# Patient Record
Sex: Female | Born: 1965 | Race: Black or African American | Hispanic: No | Marital: Single
Health system: Southern US, Community
[De-identification: ages and names within clinical notes are randomized; demographics above are authoritative.]

---

## 2001-03-13 ENCOUNTER — Encounter: Payer: Self-pay | Admitting: Family Medicine

## 2001-03-13 ENCOUNTER — Ambulatory Visit (HOSPITAL_COMMUNITY): Admission: RE | Admit: 2001-03-13 | Discharge: 2001-03-13 | Payer: Self-pay | Admitting: Family Medicine

## 2001-09-26 ENCOUNTER — Other Ambulatory Visit: Admission: RE | Admit: 2001-09-26 | Discharge: 2001-09-26 | Payer: Self-pay | Admitting: Family Medicine

## 2002-04-20 ENCOUNTER — Ambulatory Visit (HOSPITAL_COMMUNITY): Admission: RE | Admit: 2002-04-20 | Discharge: 2002-04-20 | Payer: Self-pay | Admitting: Family Medicine

## 2002-04-20 ENCOUNTER — Encounter: Payer: Self-pay | Admitting: Family Medicine

## 2002-11-25 ENCOUNTER — Other Ambulatory Visit: Admission: RE | Admit: 2002-11-25 | Discharge: 2002-11-25 | Payer: Self-pay | Admitting: Ophthalmology

## 2003-07-27 ENCOUNTER — Ambulatory Visit (HOSPITAL_COMMUNITY): Admission: RE | Admit: 2003-07-27 | Discharge: 2003-07-27 | Payer: Self-pay | Admitting: Endocrinology

## 2004-05-18 ENCOUNTER — Other Ambulatory Visit: Admission: RE | Admit: 2004-05-18 | Discharge: 2004-05-18 | Payer: Self-pay | Admitting: Family Medicine

## 2005-06-26 ENCOUNTER — Other Ambulatory Visit: Admission: RE | Admit: 2005-06-26 | Discharge: 2005-06-26 | Payer: Self-pay | Admitting: Family Medicine

## 2006-09-18 ENCOUNTER — Other Ambulatory Visit: Admission: RE | Admit: 2006-09-18 | Discharge: 2006-09-18 | Payer: Self-pay | Admitting: Family Medicine

## 2010-04-18 ENCOUNTER — Encounter (INDEPENDENT_AMBULATORY_CARE_PROVIDER_SITE_OTHER): Payer: Self-pay | Admitting: *Deleted

## 2010-04-18 ENCOUNTER — Ambulatory Visit: Payer: Self-pay | Admitting: Internal Medicine

## 2010-04-18 LAB — CONVERTED CEMR LAB
ALT: 30 units/L (ref 0–35)
AST: 27 units/L (ref 0–37)
Albumin: 4.4 g/dL (ref 3.5–5.2)
Alkaline Phosphatase: 82 units/L (ref 39–117)
BUN: 8 mg/dL (ref 6–23)
CO2: 24 meq/L (ref 19–32)
Calcium: 8.9 mg/dL (ref 8.4–10.5)
Chloride: 108 meq/L (ref 96–112)
Creatinine, Ser: 0.73 mg/dL (ref 0.40–1.20)
Free T4: 0.79 ng/dL — ABNORMAL LOW (ref 0.80–1.80)
Glucose, Bld: 93 mg/dL (ref 70–99)
Potassium: 4.9 meq/L (ref 3.5–5.3)
Sodium: 141 meq/L (ref 135–145)
T3, Total: 72.1 ng/dL — ABNORMAL LOW (ref 80.0–204.0)
TSH: 4.965 microintl units/mL — ABNORMAL HIGH (ref 0.350–4.500)
Total Bilirubin: 0.3 mg/dL (ref 0.3–1.2)
Total Protein: 7.3 g/dL (ref 6.0–8.3)

## 2010-04-28 ENCOUNTER — Emergency Department (HOSPITAL_COMMUNITY): Admission: EM | Admit: 2010-04-28 | Discharge: 2010-04-28 | Payer: Self-pay | Admitting: Family Medicine

## 2011-10-29 ENCOUNTER — Other Ambulatory Visit: Payer: Self-pay | Admitting: Family Medicine

## 2013-06-11 ENCOUNTER — Other Ambulatory Visit (HOSPITAL_COMMUNITY): Payer: Self-pay | Admitting: Nurse Practitioner

## 2013-06-11 DIAGNOSIS — Z1231 Encounter for screening mammogram for malignant neoplasm of breast: Secondary | ICD-10-CM

## 2013-06-25 ENCOUNTER — Ambulatory Visit (HOSPITAL_COMMUNITY)
Admission: RE | Admit: 2013-06-25 | Discharge: 2013-06-25 | Disposition: A | Discharge status: Home / Self Care | Source: Ambulatory Visit | Attending: Nurse Practitioner | Admitting: Nurse Practitioner

## 2013-06-25 DIAGNOSIS — Z1231 Encounter for screening mammogram for malignant neoplasm of breast: Secondary | ICD-10-CM | POA: Insufficient documentation

## 2014-12-22 ENCOUNTER — Other Ambulatory Visit: Payer: Self-pay | Admitting: Physician Assistant

## 2014-12-22 ENCOUNTER — Other Ambulatory Visit (HOSPITAL_COMMUNITY)
Admission: RE | Admit: 2014-12-22 | Discharge: 2014-12-22 | Disposition: A | Discharge status: Home / Self Care | Payer: 59 | Source: Ambulatory Visit | Attending: Family Medicine | Admitting: Family Medicine

## 2014-12-22 DIAGNOSIS — Z124 Encounter for screening for malignant neoplasm of cervix: Secondary | ICD-10-CM | POA: Diagnosis not present

## 2014-12-27 ENCOUNTER — Other Ambulatory Visit (HOSPITAL_COMMUNITY): Payer: Self-pay | Admitting: Physician Assistant

## 2014-12-27 DIAGNOSIS — Z1231 Encounter for screening mammogram for malignant neoplasm of breast: Secondary | ICD-10-CM

## 2014-12-27 LAB — CYTOLOGY - PAP

## 2015-01-03 ENCOUNTER — Ambulatory Visit (HOSPITAL_COMMUNITY)
Admission: RE | Admit: 2015-01-03 | Discharge: 2015-01-03 | Disposition: A | Discharge status: Home / Self Care | Payer: 59 | Source: Ambulatory Visit | Attending: Physician Assistant | Admitting: Physician Assistant

## 2015-01-03 DIAGNOSIS — Z1231 Encounter for screening mammogram for malignant neoplasm of breast: Secondary | ICD-10-CM

## 2016-02-27 ENCOUNTER — Other Ambulatory Visit: Payer: Self-pay | Admitting: Gastroenterology

## 2016-03-23 ENCOUNTER — Other Ambulatory Visit: Payer: Self-pay | Admitting: Physician Assistant

## 2016-03-23 DIAGNOSIS — Z1231 Encounter for screening mammogram for malignant neoplasm of breast: Secondary | ICD-10-CM

## 2017-10-28 ENCOUNTER — Other Ambulatory Visit: Payer: Self-pay | Admitting: Physician Assistant

## 2017-10-28 DIAGNOSIS — R748 Abnormal levels of other serum enzymes: Secondary | ICD-10-CM

## 2017-10-31 ENCOUNTER — Ambulatory Visit
Admission: RE | Admit: 2017-10-31 | Discharge: 2017-10-31 | Disposition: A | Discharge status: Home / Self Care | Payer: BLUE CROSS/BLUE SHIELD | Source: Ambulatory Visit | Attending: Physician Assistant | Admitting: Physician Assistant

## 2017-10-31 DIAGNOSIS — R748 Abnormal levels of other serum enzymes: Secondary | ICD-10-CM

## 2018-01-22 ENCOUNTER — Other Ambulatory Visit: Payer: Self-pay | Admitting: Physician Assistant

## 2018-01-22 DIAGNOSIS — Z1231 Encounter for screening mammogram for malignant neoplasm of breast: Secondary | ICD-10-CM

## 2018-02-17 ENCOUNTER — Ambulatory Visit
Admission: RE | Admit: 2018-02-17 | Discharge: 2018-02-17 | Disposition: A | Discharge status: Home / Self Care | Payer: BLUE CROSS/BLUE SHIELD | Source: Ambulatory Visit | Attending: Physician Assistant | Admitting: Physician Assistant

## 2018-02-17 DIAGNOSIS — Z1231 Encounter for screening mammogram for malignant neoplasm of breast: Secondary | ICD-10-CM

## 2018-02-20 ENCOUNTER — Other Ambulatory Visit: Payer: Self-pay | Admitting: Physician Assistant

## 2018-02-20 ENCOUNTER — Other Ambulatory Visit (HOSPITAL_COMMUNITY)
Admission: RE | Admit: 2018-02-20 | Discharge: 2018-02-20 | Disposition: A | Discharge status: Home / Self Care | Payer: BLUE CROSS/BLUE SHIELD | Source: Ambulatory Visit | Attending: Family Medicine | Admitting: Family Medicine

## 2018-02-20 DIAGNOSIS — Z124 Encounter for screening for malignant neoplasm of cervix: Secondary | ICD-10-CM | POA: Diagnosis not present

## 2018-02-24 LAB — CYTOLOGY - PAP: Diagnosis: NEGATIVE

## 2020-04-22 ENCOUNTER — Other Ambulatory Visit: Payer: Self-pay | Admitting: Family Medicine

## 2020-04-22 DIAGNOSIS — Z1231 Encounter for screening mammogram for malignant neoplasm of breast: Secondary | ICD-10-CM

## 2020-06-14 ENCOUNTER — Ambulatory Visit: Payer: BLUE CROSS/BLUE SHIELD

## 2020-06-24 ENCOUNTER — Other Ambulatory Visit: Payer: Self-pay

## 2020-06-24 ENCOUNTER — Ambulatory Visit
Admission: RE | Admit: 2020-06-24 | Discharge: 2020-06-24 | Disposition: A | Discharge status: Home / Self Care | Payer: Managed Care, Other (non HMO) | Source: Ambulatory Visit | Attending: Family Medicine | Admitting: Family Medicine

## 2020-06-24 DIAGNOSIS — Z1231 Encounter for screening mammogram for malignant neoplasm of breast: Secondary | ICD-10-CM

## 2020-06-30 ENCOUNTER — Other Ambulatory Visit: Payer: Self-pay | Admitting: Family Medicine

## 2020-06-30 DIAGNOSIS — N6489 Other specified disorders of breast: Secondary | ICD-10-CM

## 2020-08-16 ENCOUNTER — Other Ambulatory Visit: Payer: Self-pay

## 2020-08-16 ENCOUNTER — Ambulatory Visit
Admission: RE | Admit: 2020-08-16 | Discharge: 2020-08-16 | Disposition: A | Discharge status: Home / Self Care | Payer: Managed Care, Other (non HMO) | Source: Ambulatory Visit | Attending: Family Medicine | Admitting: Family Medicine

## 2020-08-16 ENCOUNTER — Other Ambulatory Visit: Payer: Self-pay | Admitting: Family Medicine

## 2020-08-16 DIAGNOSIS — N6489 Other specified disorders of breast: Secondary | ICD-10-CM

## 2020-08-16 DIAGNOSIS — R921 Mammographic calcification found on diagnostic imaging of breast: Secondary | ICD-10-CM

## 2021-04-27 ENCOUNTER — Other Ambulatory Visit: Payer: Self-pay | Admitting: Family Medicine

## 2021-04-27 DIAGNOSIS — Z1231 Encounter for screening mammogram for malignant neoplasm of breast: Secondary | ICD-10-CM

## 2021-05-19 ENCOUNTER — Other Ambulatory Visit: Payer: Self-pay | Admitting: Family Medicine

## 2021-05-19 DIAGNOSIS — R921 Mammographic calcification found on diagnostic imaging of breast: Secondary | ICD-10-CM

## 2021-06-27 ENCOUNTER — Ambulatory Visit
Admission: RE | Admit: 2021-06-27 | Discharge: 2021-06-27 | Disposition: A | Discharge status: Home / Self Care | Payer: Managed Care, Other (non HMO) | Source: Ambulatory Visit | Attending: Family Medicine | Admitting: Family Medicine

## 2021-06-27 DIAGNOSIS — R921 Mammographic calcification found on diagnostic imaging of breast: Secondary | ICD-10-CM

## 2022-09-23 IMAGING — MG DIGITAL DIAGNOSTIC BILAT W/ TOMO W/ CAD
8 of 12 series · 8 of 32 positions shown · non-contrast
Comparison: Previous exam(s).

CLINICAL DATA: Short-term interval follow-up of probable benign
calcifications in the right breast initially seen on screening
mammogram dated 06/24/2020.

EXAM:
DIGITAL DIAGNOSTIC BILATERAL MAMMOGRAM WITH TOMOSYNTHESIS AND CAD
TECHNIQUE: Bilateral digital diagnostic mammography and breast tomosynthesis
was performed. The images were evaluated with computer-aided
detection.

[R ML]
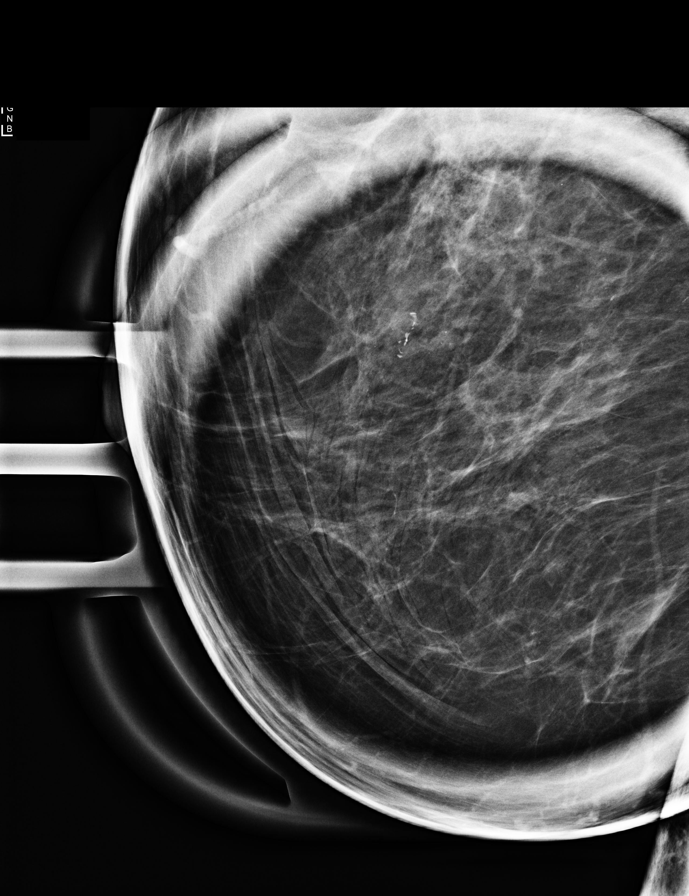

[R CC]
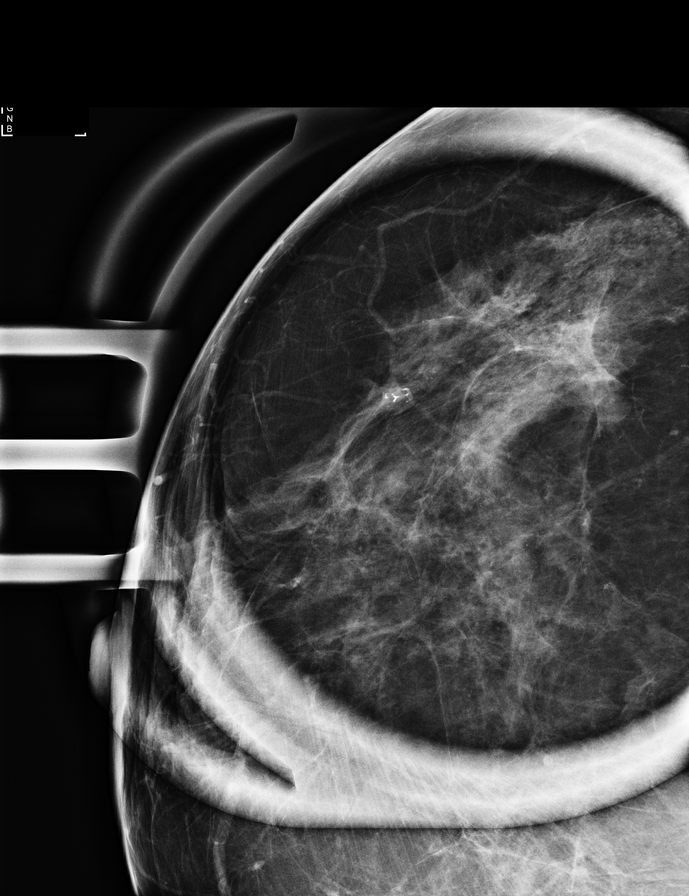

[R MLO synth-2D]
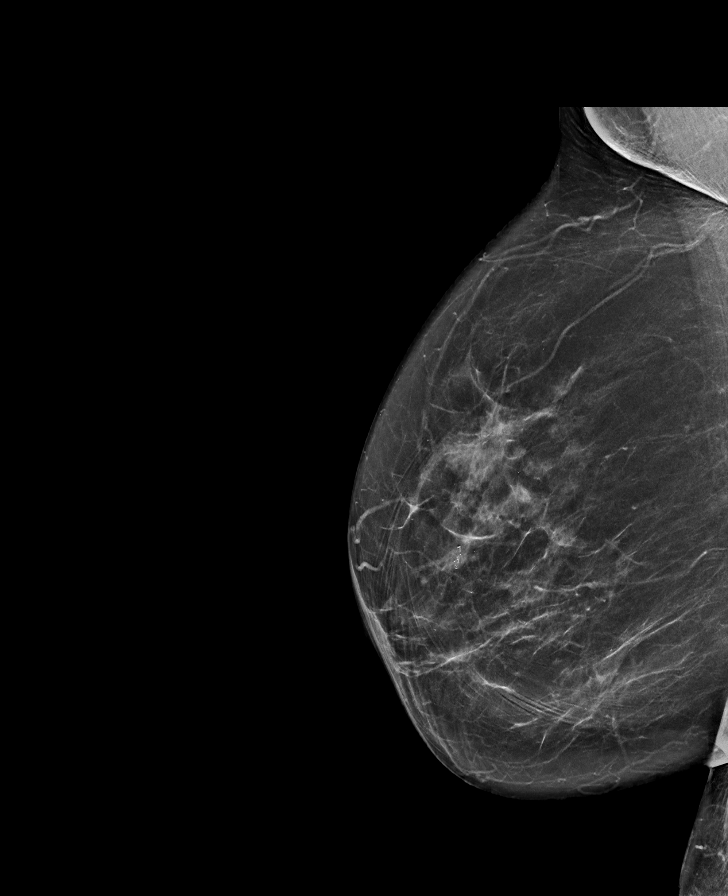

[L MLO synth-2D]
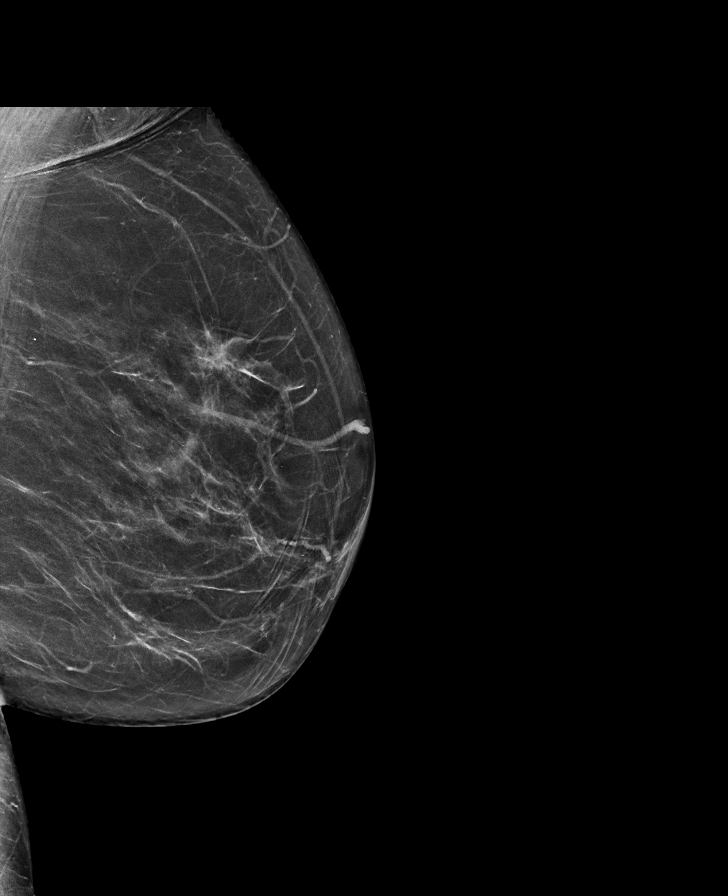

[R CC synth-2D]
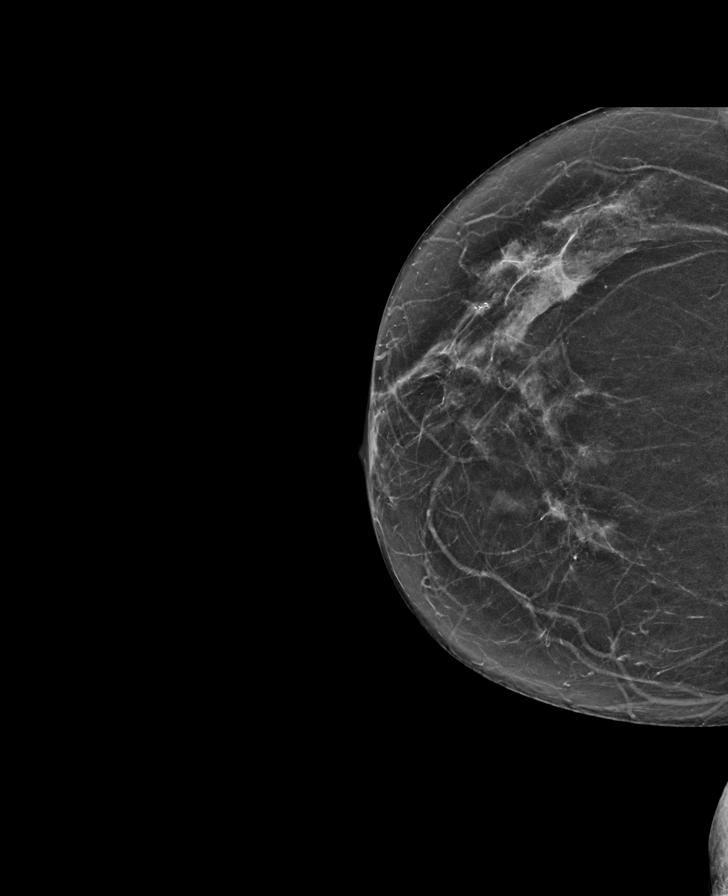

[R ML synth-2D]
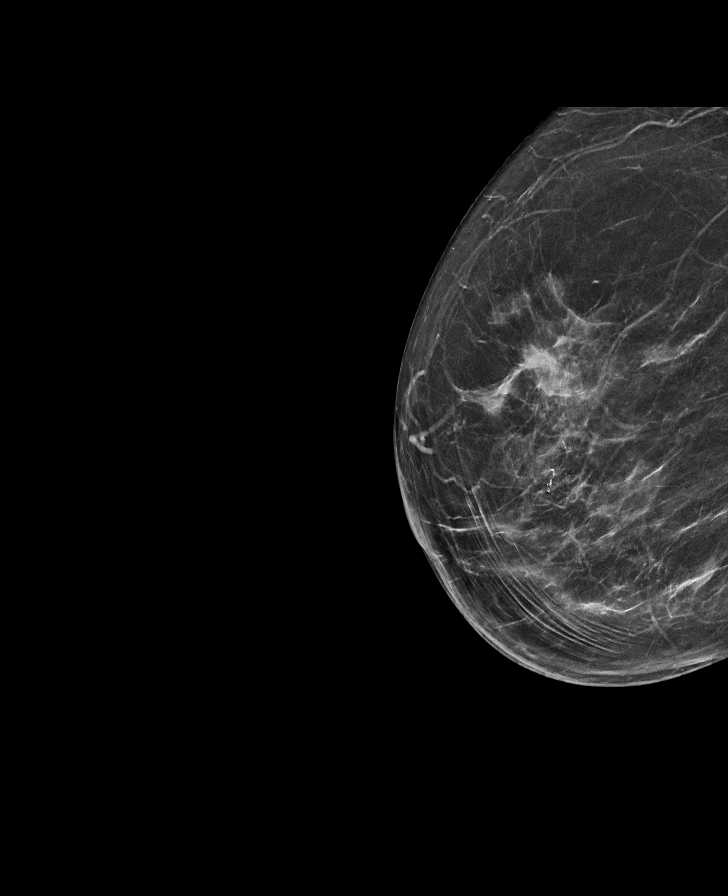

[L CC synth-2D]
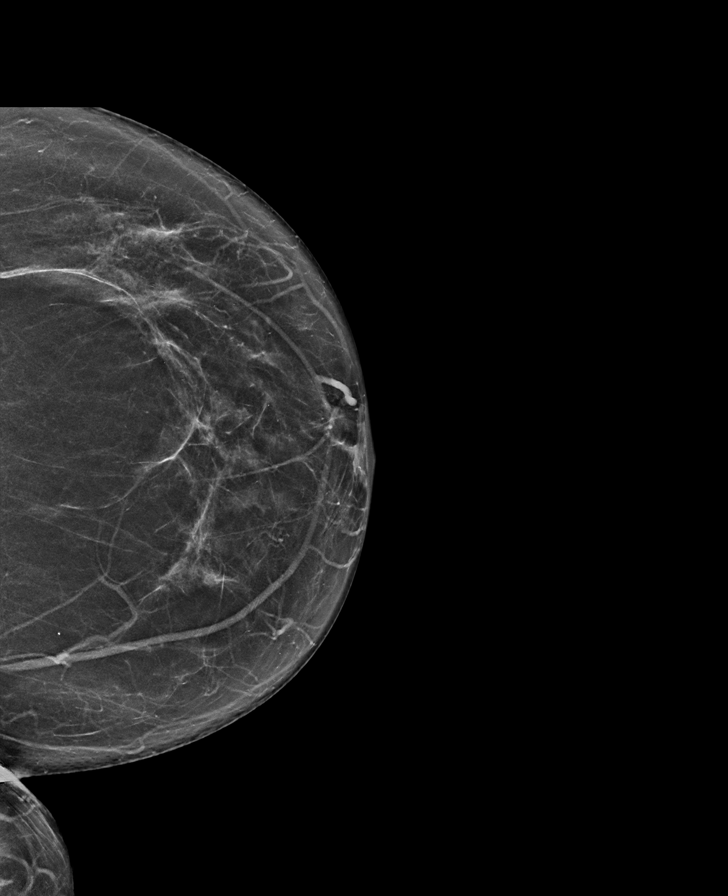

[R ML tomo · tomo slice 33/65.0]
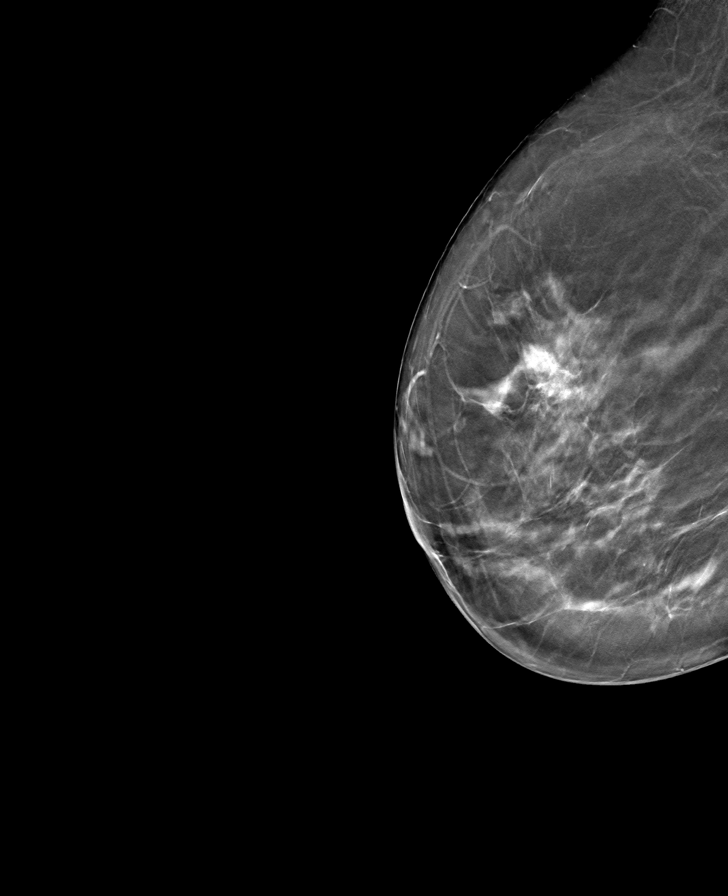

[8 of 32 positions shown; findings below may reference images not displayed]

ACR Breast Density Category b: There are scattered areas of
fibroglandular density.
FINDINGS: 8 mm grouped coarse calcifications in the lateral aspect of the
right breast are stable and likely dystrophic. No suspicious mass or
malignant type microcalcifications identified in either breast.
IMPRESSION: Stable probable benign calcifications in the right breast.

RECOMMENDATION:
Bilateral diagnostic mammogram in 1 year is recommended.

I have discussed the findings and recommendations with the patient.
If applicable, a reminder letter will be sent to the patient
regarding the next appointment.

BI-RADS CATEGORY  3: Probably benign.
